# Patient Record
Sex: Male | Born: 1980 | Race: White | Hispanic: No | Marital: Married | Smoking: Current every day smoker
Health system: Southern US, Community
[De-identification: ages and names within clinical notes are randomized; demographics above are authoritative.]

## PROBLEM LIST (undated history)

## (undated) HISTORY — PX: TONSILLECTOMY: SUR1361

---

## 2006-01-09 ENCOUNTER — Emergency Department: Payer: Self-pay | Admitting: Emergency Medicine

## 2007-04-10 IMAGING — CR DG HAND COMPLETE 3+V*L*
1 series · 3 of 3 positions shown · non-contrast
Comparison: none

REASON FOR EXAM: Injury
COMMENTS:

[Series 1: view not recorded · 0.17mm/px · 3 of 3 slices shown]
[im 1/3]
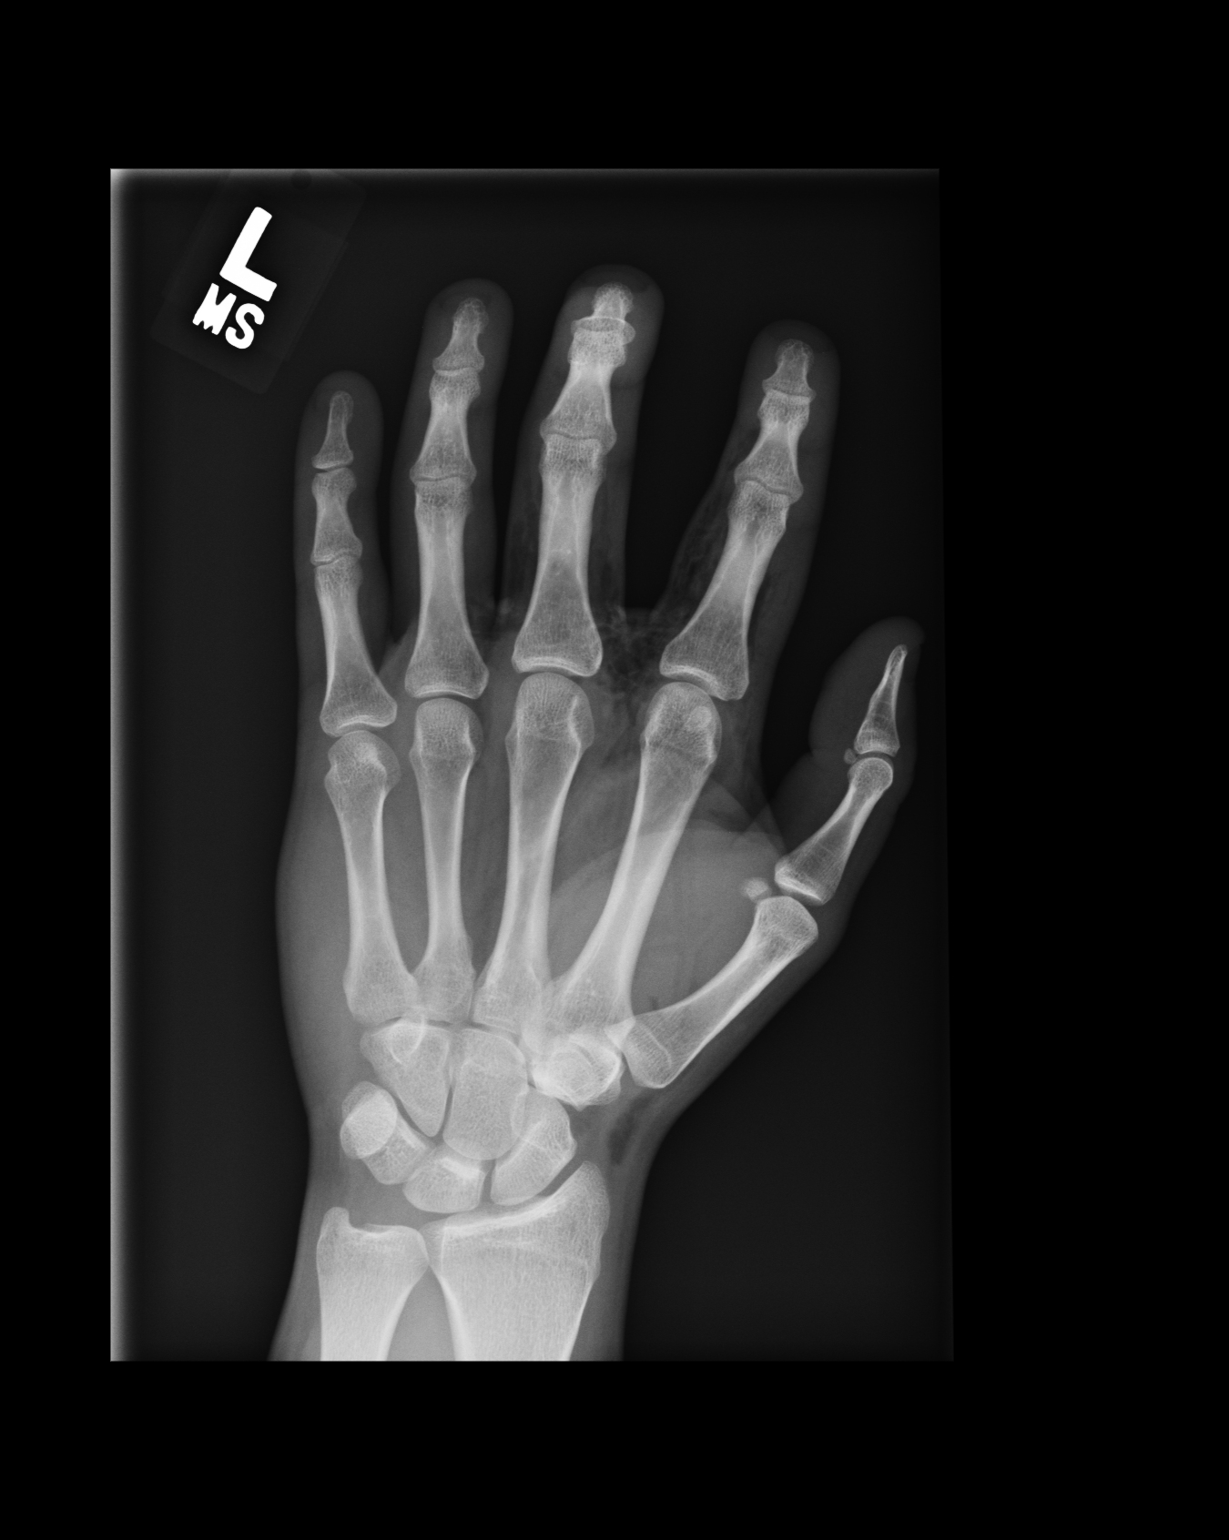
[im 2/3]
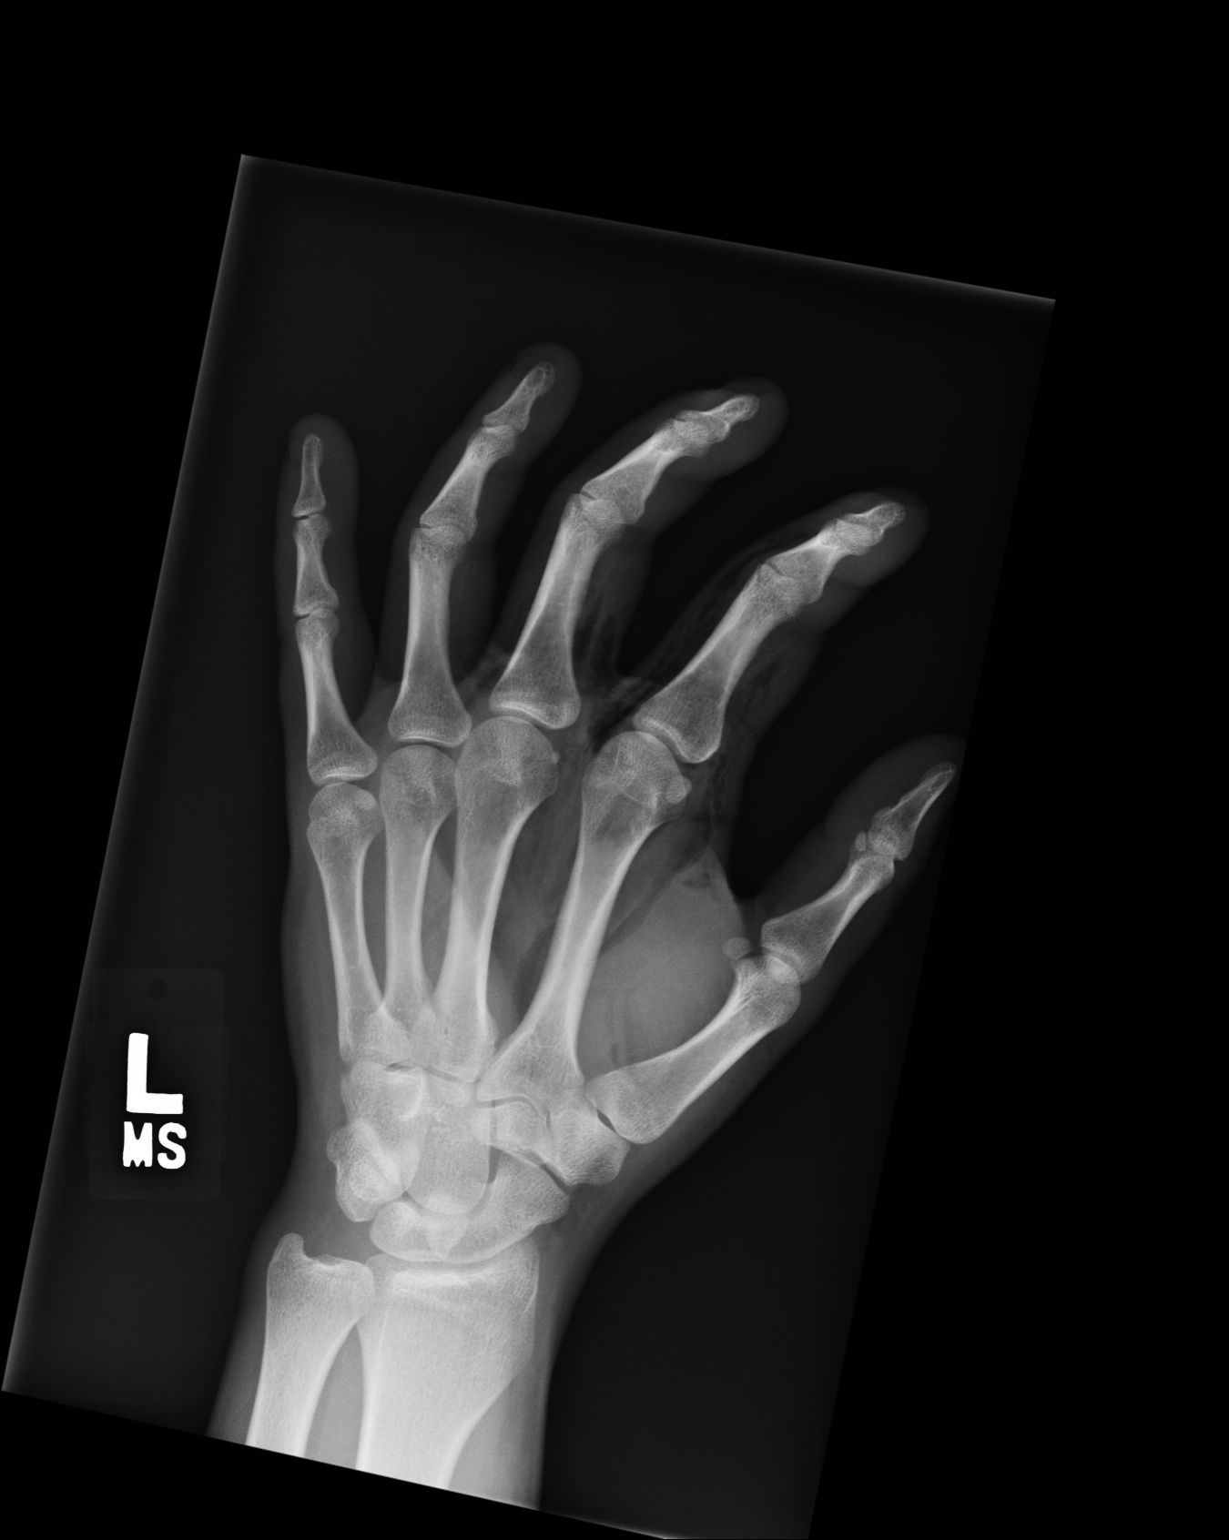
[im 3/3]
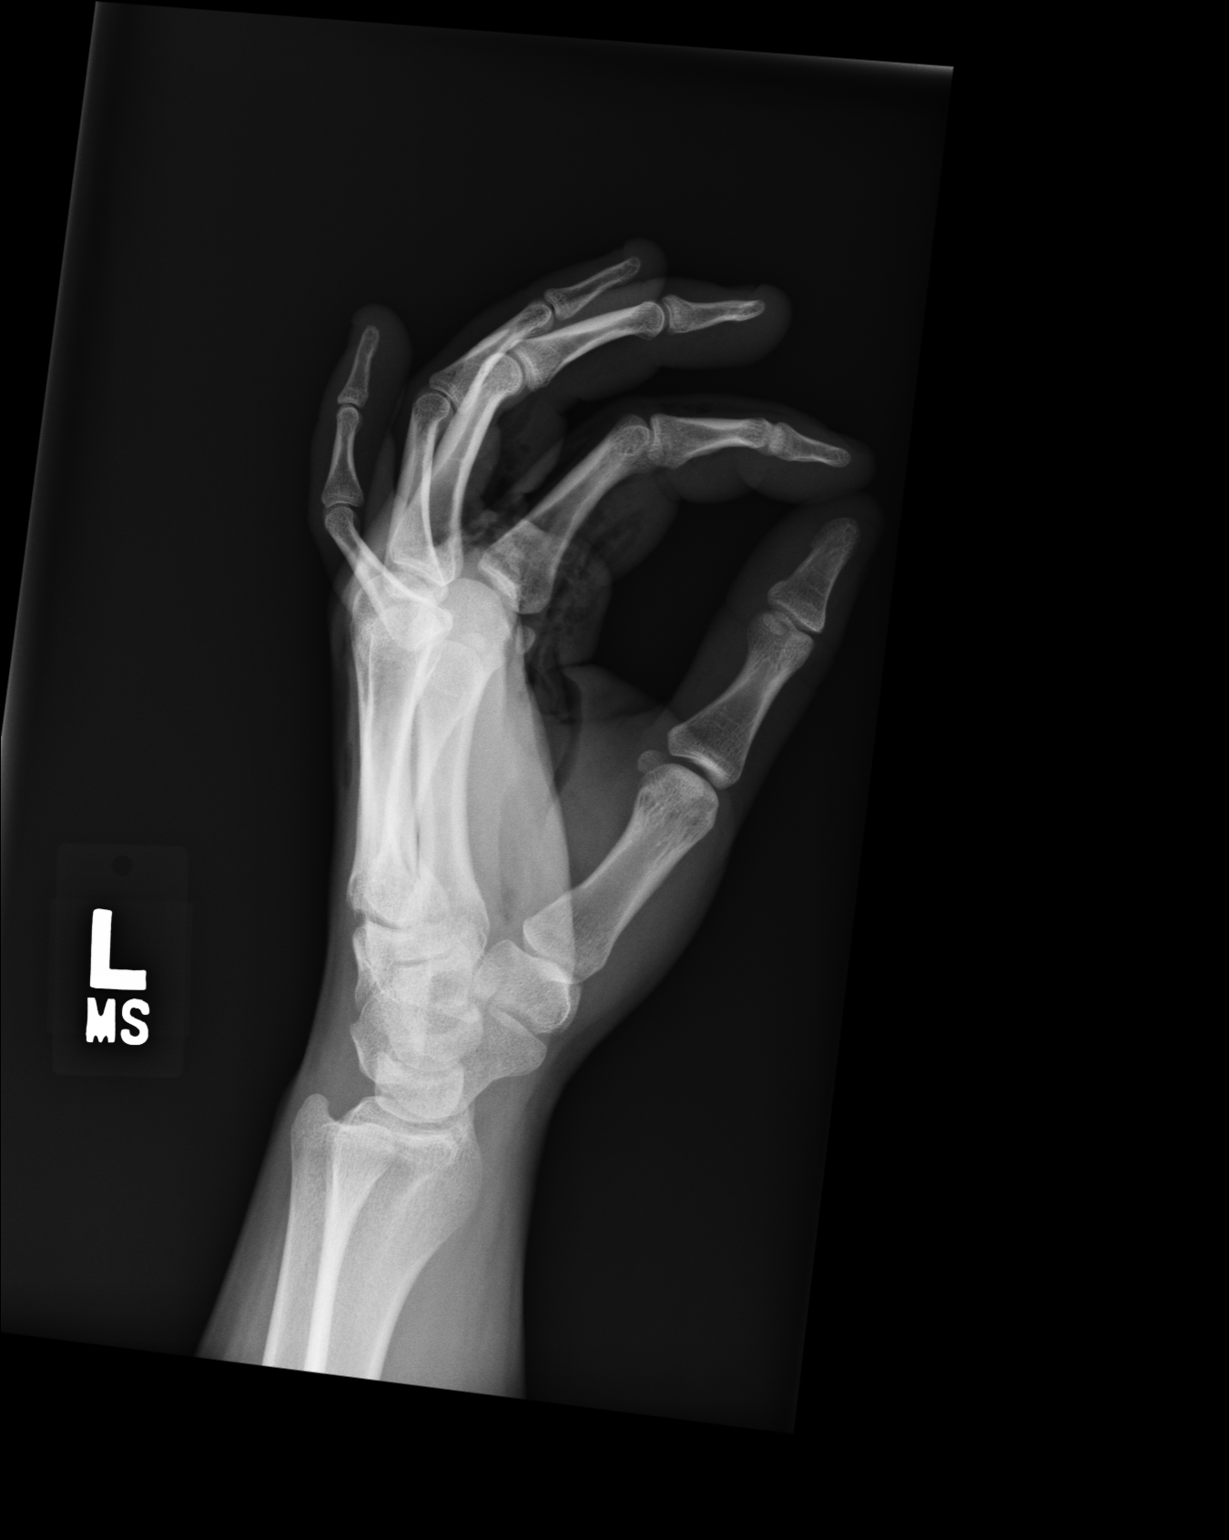

[3 of 3 positions shown; findings below may reference images not displayed]

PROCEDURE:     DXR - DXR HAND LT COMPLETE  W/OBLIQUES  - January 09, 2006  [DATE]

RESULT:        Subcutaneous emphysema is noted between the second and third
digits and along the base of the thumb.   Subcutaneous emphysema is noted
tracking along the palmar aspect of the second metacarpal.  Penetrating
injury or air-producing infection could present in this fashion.  No acute
bony abnormalities are identified.
IMPRESSION: Subcutaneous emphysema noted distributed as described above. This could be
secondary to
penetrating injury or air-producing infection.  No acute bony abnormalities
identified.

## 2007-05-19 ENCOUNTER — Emergency Department: Payer: Self-pay | Admitting: Emergency Medicine

## 2008-08-17 IMAGING — CR RIGHT INDEX FINGER 2+V
1 series · 3 of 3 positions shown · non-contrast
Comparison: none

REASON FOR EXAM: trauma
COMMENTS:

[Series 1: view not recorded · 0.17mm/px · 3 of 3 slices shown]
[im 1/3]
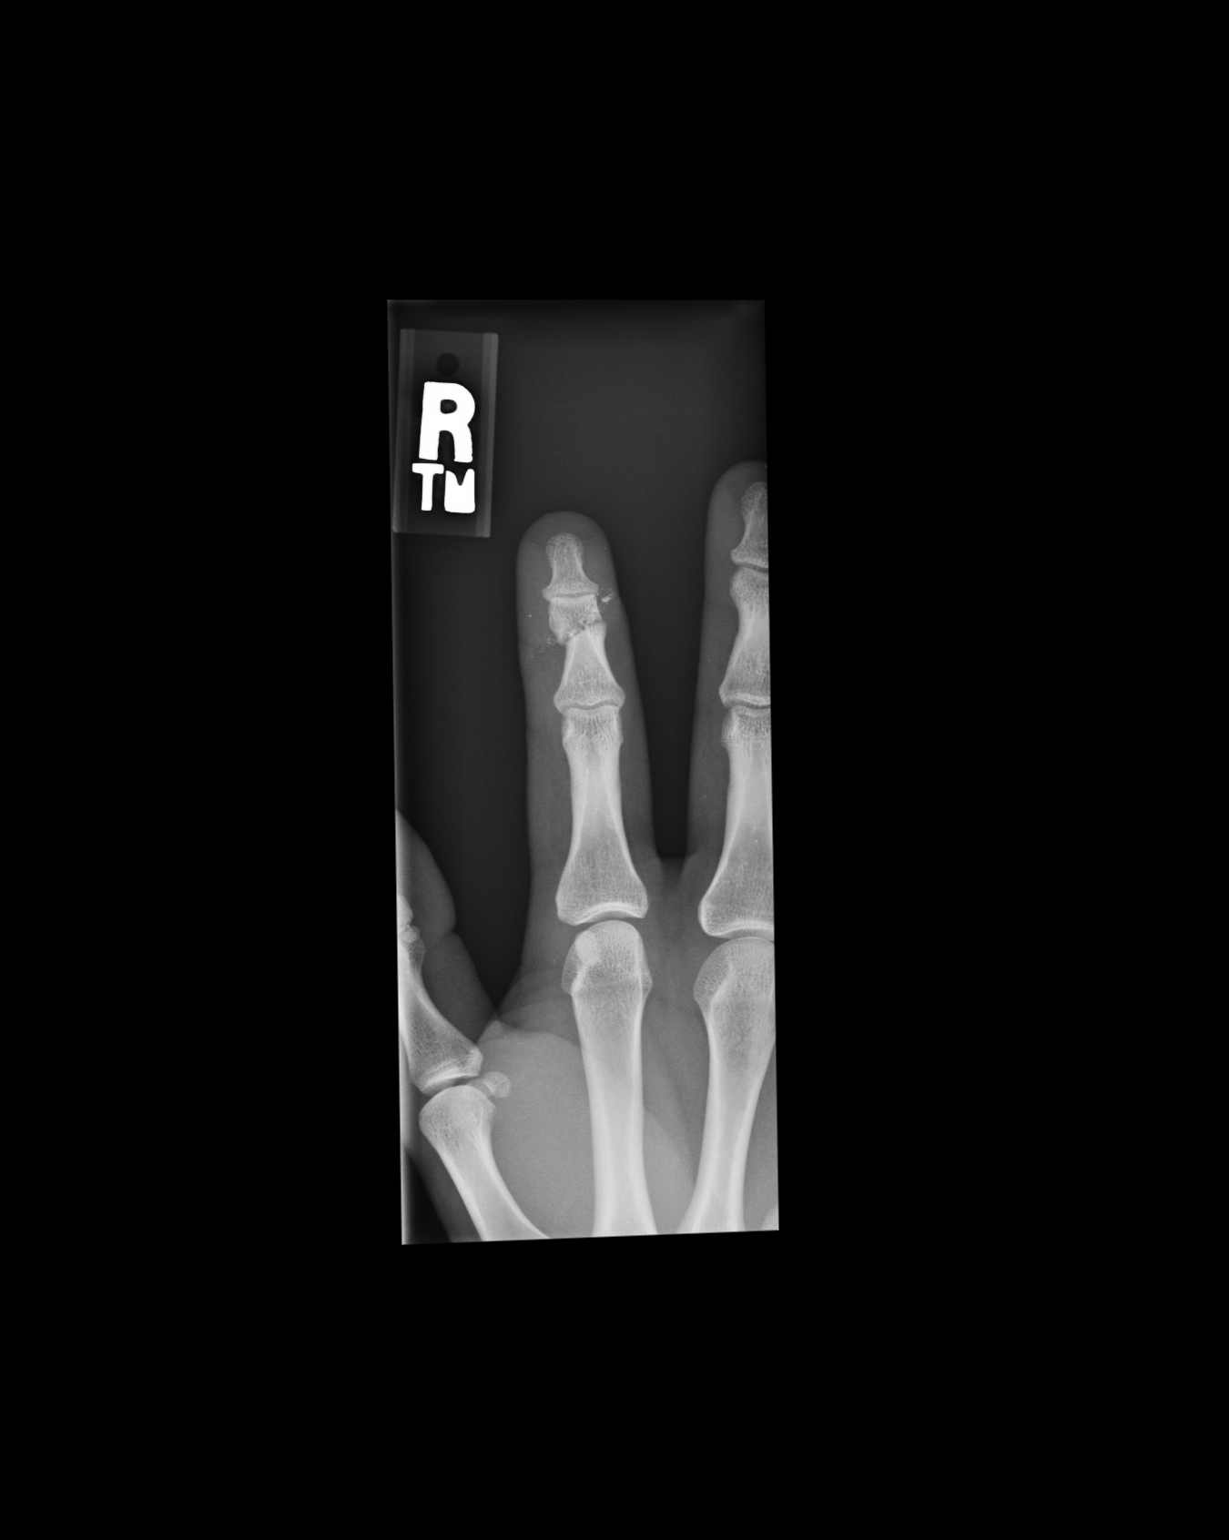
[im 2/3]
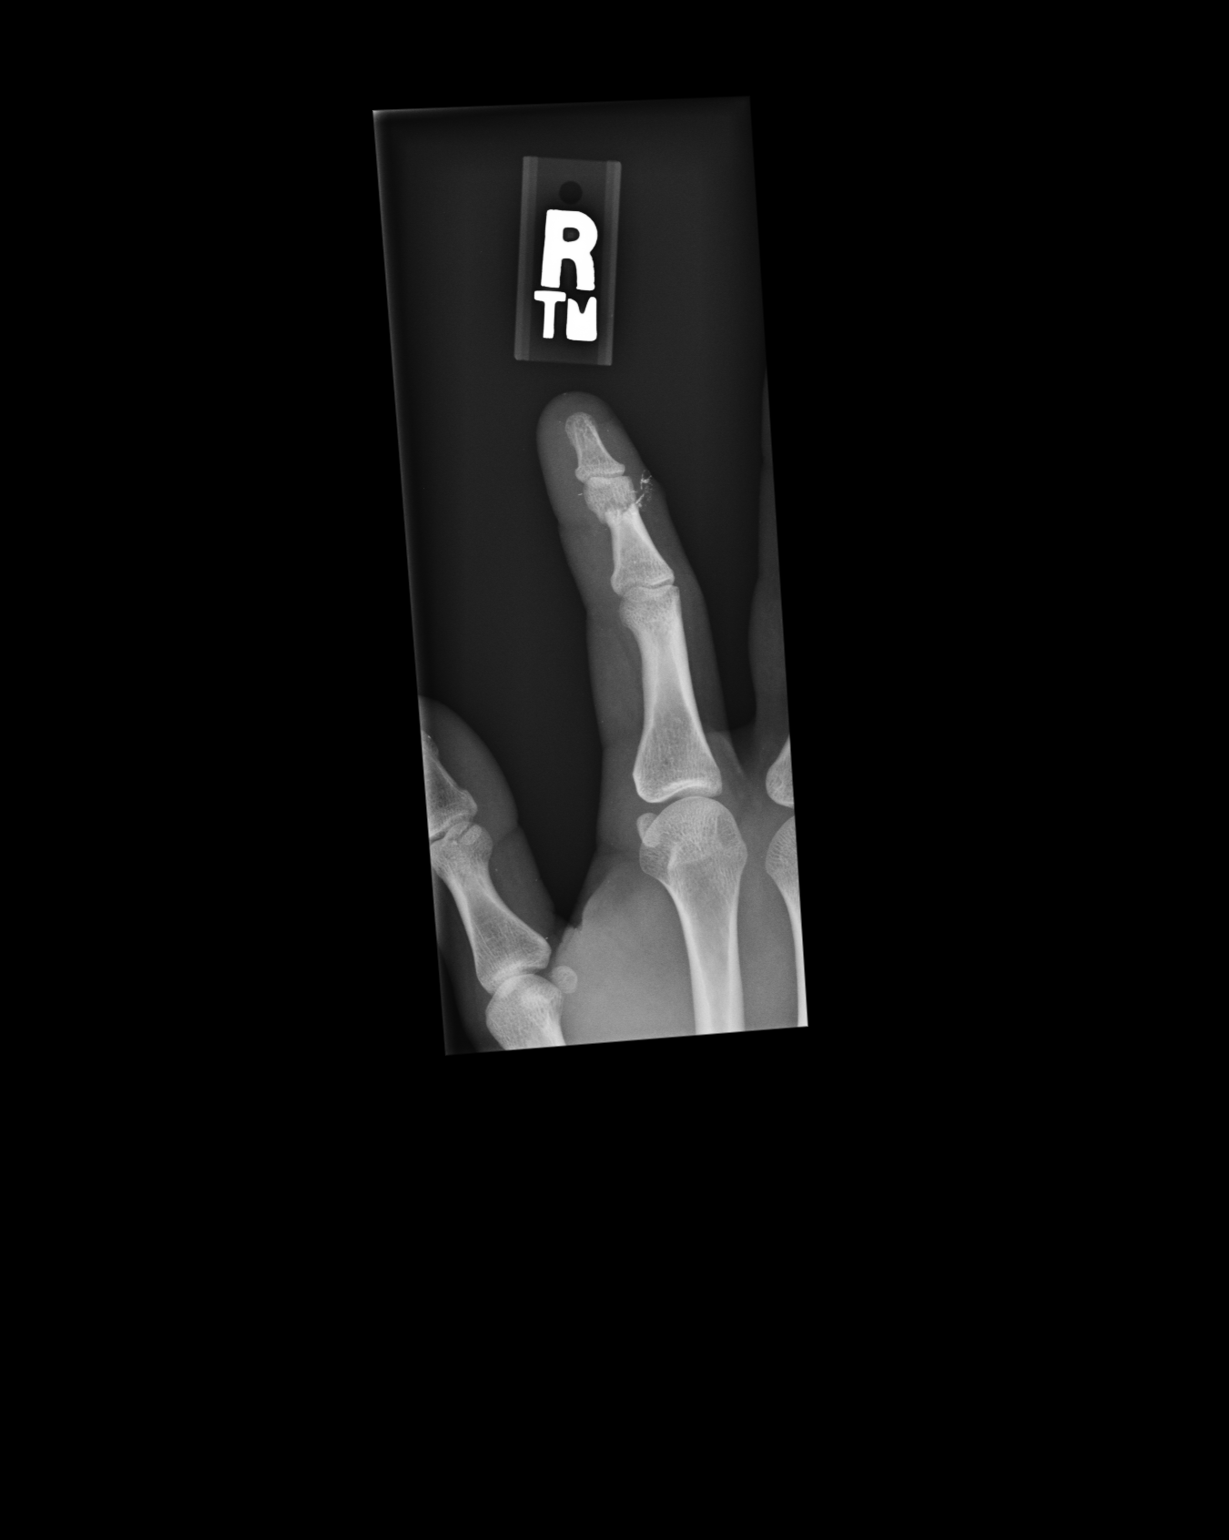
[im 3/3]
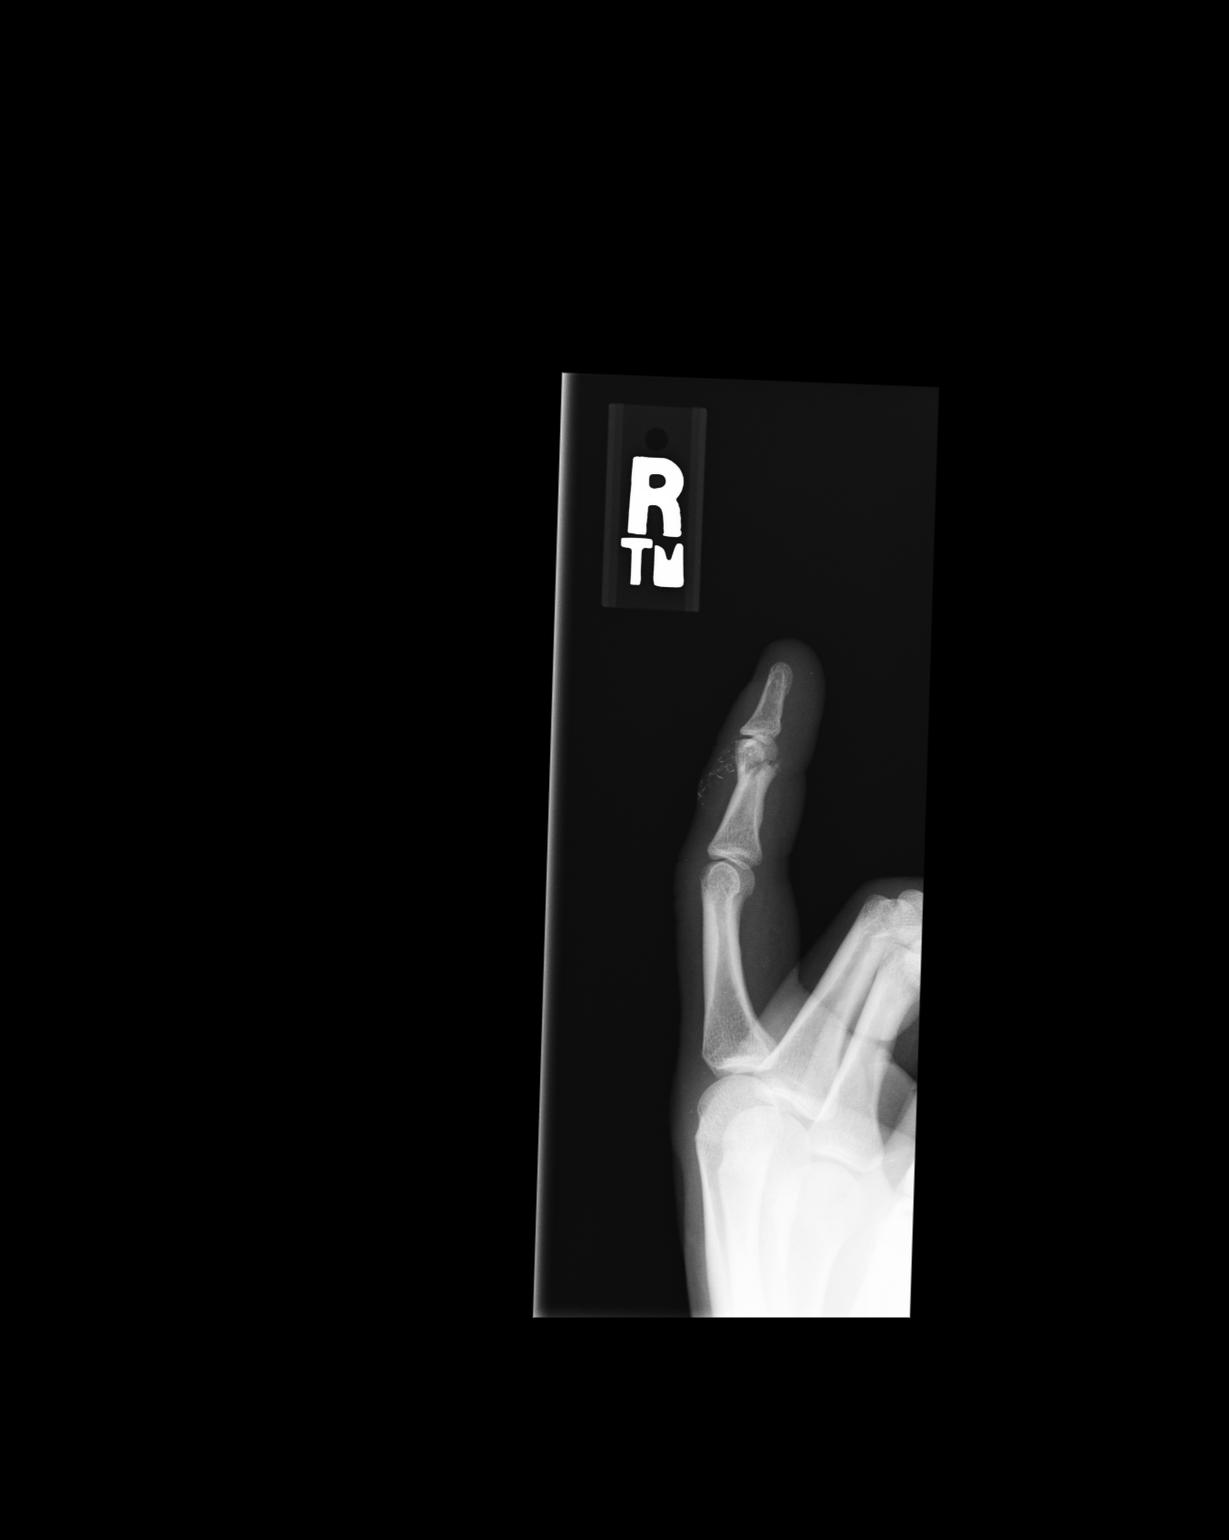

[3 of 3 positions shown; findings below may reference images not displayed]

PROCEDURE:     DXR - DXR FINGER INDEX 2ND DIGIT RT HA  - May 19, 2007  [DATE]

RESULT:     There is a transverse fracture of the distal portion of the
middle phalanx of the index finger. Radiodensities are noted in the soft
tissues medially and dorsally at the level of the fracture and appear to
represent small soft tissue metallic fragments. No other significant
abnormalities are identified.
IMPRESSION: 1.     Please see above.

## 2012-04-26 ENCOUNTER — Encounter (HOSPITAL_BASED_OUTPATIENT_CLINIC_OR_DEPARTMENT_OTHER): Payer: Self-pay | Admitting: *Deleted

## 2012-04-26 ENCOUNTER — Emergency Department (HOSPITAL_BASED_OUTPATIENT_CLINIC_OR_DEPARTMENT_OTHER)
Admission: EM | Admit: 2012-04-26 | Discharge: 2012-04-26 | Disposition: A | Discharge status: Home / Self Care | Payer: PRIVATE HEALTH INSURANCE | Attending: Emergency Medicine | Admitting: Emergency Medicine

## 2012-04-26 DIAGNOSIS — T6391XA Toxic effect of contact with unspecified venomous animal, accidental (unintentional), initial encounter: Secondary | ICD-10-CM | POA: Insufficient documentation

## 2012-04-26 DIAGNOSIS — T63461A Toxic effect of venom of wasps, accidental (unintentional), initial encounter: Secondary | ICD-10-CM | POA: Insufficient documentation

## 2012-04-26 DIAGNOSIS — R42 Dizziness and giddiness: Secondary | ICD-10-CM | POA: Insufficient documentation

## 2012-04-26 DIAGNOSIS — T63481A Toxic effect of venom of other arthropod, accidental (unintentional), initial encounter: Secondary | ICD-10-CM

## 2012-04-26 DIAGNOSIS — K089 Disorder of teeth and supporting structures, unspecified: Secondary | ICD-10-CM | POA: Insufficient documentation

## 2012-04-26 DIAGNOSIS — K029 Dental caries, unspecified: Secondary | ICD-10-CM | POA: Insufficient documentation

## 2012-04-26 DIAGNOSIS — K0889 Other specified disorders of teeth and supporting structures: Secondary | ICD-10-CM

## 2012-04-26 MED ORDER — CLINDAMYCIN HCL 150 MG PO CAPS: 150.0000 mg | ORAL_CAPSULE | Freq: Three times a day (TID) | ORAL | Status: AC

## 2012-04-26 NOTE — ED Notes (Signed)
Pt states he was mowing a couple of hours ago and got stung on his left ankle. Took shower and noticed a red rash was appearing. No SHOB, but some gum pain.

## 2012-04-26 NOTE — Discharge Instructions (Signed)
Bee, Wasp, or Hornet Sting Your caregiver has diagnosed you as having an insect sting. An insect sting appears as a red lump in the skin that sometimes has a tiny hole in the center, or it may have a stinger in the center of the wound. The most common stings are from wasps, hornets and bees. Individuals have different reactions to insect stings.  A normal reaction may cause pain, swelling, and redness around the sting site.   A localized allergic reaction may cause swelling and redness that extends beyond the sting site.   A large local reaction may continue to develop over the next 12 to 36 hours.   On occasion, the reactions can be severe (anaphylactic reaction). An anaphylactic reaction may cause wheezing; difficulty breathing; chest pain; fainting; raised, itchy, red patches on the skin; a sick feeling to your stomach (nausea); vomiting; cramping; or diarrhea. If you have had an anaphylactic reaction to an insect sting in the past, you are more likely to have one again.  HOME CARE INSTRUCTIONS   With bee stings, a small sac of poison is left in the wound. Brushing across this with something such as a credit card, or anything similar, will help remove this and decrease the amount of the reaction. This same procedure will not help a wasp sting as they do not leave behind a stinger and poison sac.   Apply a cold compress for 10 to 20 minutes every hour for 1 to 2 days, depending on severity, to reduce swelling and itching.   To lessen pain, a paste made of water and baking soda may be rubbed on the bite or sting and left on for 5 minutes.   To relieve itching and swelling, you may use take medication or apply medicated creams or lotions as directed.   Only take over-the-counter or prescription medicines for pain, discomfort, or fever as directed by your caregiver.   Wash the sting site daily with soap and water. Apply antibiotic ointment on the sting site as directed.   If you suffered a  severe reaction:   If you did not require hospitalization, an adult will need to stay with you for 24 hours in case the symptoms return.   You may need to wear a medical bracelet or necklace stating the allergy.   You and your family need to learn when and how to use an anaphylaxis kit or epinephrine injection.   If you have had a severe reaction before, always carry your anaphylaxis kit with you.  SEEK MEDICAL CARE IF:   None of the above helps within 2 to 3 days.   The area becomes red, warm, tender, and swollen beyond the area of the bite or sting.   You have an oral temperature above 102 F (38.9 C).  SEEK IMMEDIATE MEDICAL CARE IF:  You have symptoms of an allergic reaction which are:  Wheezing.   Difficulty breathing.   Chest pain.   Lightheadedness or fainting.   Itchy, raised, red patches on the skin.   Nausea, vomiting, cramping or diarrhea.  ANY OF THESE SYMPTOMS MAY REPRESENT A SERIOUS PROBLEM THAT IS AN EMERGENCY. Do not wait to see if the symptoms will go away. Get medical help right away. Call your local emergency services (911 in [place] DO NOT drive yourself to the hospital. MAKE SURE YOU:   Understand these instructions.   Will watch your condition.   Will get help right away if you are not doing well or  get worse.  Document Released: 10/14/2005 Document Revised: 10/03/2011 Document Reviewed: 03/31/2010 [facility] Patient Information 2012 [place], [place].Dental Pain A tooth ache may be caused by cavities (tooth decay). Cavities expose the nerve of the tooth to air and hot or cold temperatures. It may come from an infection or abscess (also called a boil or furuncle) around your tooth. It is also often caused by dental caries (tooth decay). This causes the pain you are having. DIAGNOSIS  Your caregiver can diagnose this problem by exam. TREATMENT   If caused by an infection, it may be treated with medications which kill germs (antibiotics) and pain  medications as prescribed by your caregiver. Take medications as directed.   Only take over-the-counter or prescription medicines for pain, discomfort, or fever as directed by your caregiver.   Whether the tooth ache today is caused by infection or dental disease, you should see your dentist as soon as possible for further care.  SEEK MEDICAL CARE IF: The exam and treatment you received today has been provided on an emergency basis only. This is not a substitute for complete medical or dental care. If your problem worsens or new problems (symptoms) appear, and you are unable to meet with your dentist, call or return to this location. SEEK IMMEDIATE MEDICAL CARE IF:   You have a fever.   You develop redness and swelling of your face, jaw, or neck.   You are unable to open your mouth.   You have severe pain uncontrolled by pain medicine.  MAKE SURE YOU:   Understand these instructions.   Will watch your condition.   Will get help right away if you are not doing well or get worse.  Document Released: 10/14/2005 Document Revised: 10/03/2011 Document Reviewed: 06/01/2008 [facility] Patient Information 2012 [place], [place].

## 2012-04-26 NOTE — ED Provider Notes (Signed)
History     CSN: [id number]  Arrival date & time 04/26/12  1659   First MD Initiated Contact with Patient 04/26/12 1715      Chief Complaint  Patient presents with  . Insect Bite    (Consider location/radiation/quality/duration/timing/severity/associated sxs/prior treatment) HPI Comments: Pt c/o right upper dentition pain:pt states that he was stung by a bee on his left ankle and now he has redness to the area  Patient is a 31 y.o. male presenting with tooth pain. The history is provided by the patient. No language interpreter was used.  Dental PainThe primary symptoms include mouth pain. The symptoms began 5 to 7 days ago. The symptoms are unchanged. The symptoms occur constantly.  Additional symptoms include: gum tenderness.    History reviewed. No pertinent past medical history.  Past Surgical History  Procedure Date  . Tonsillectomy     History reviewed. No pertinent family history.  History  Substance Use Topics  . Smoking status: Current Everyday Smoker  . Smokeless tobacco: Not on file  . Alcohol Use: Yes      Review of Systems  Constitutional: Negative.   Respiratory: Negative.   Cardiovascular: Negative.   Neurological: Positive for dizziness.    Allergies  Penicillins  Home Medications   Current Outpatient Rx  Name Route Sig Dispense Refill  . DIPHENHYDRAMINE HCL 25 MG PO TABS Oral Take 50 mg by mouth once as needed. For allergic reaction    . MELATONIN 5 MG PO TABS Oral Take 1 tablet by mouth at bedtime as needed. For sleep    . CLINDAMYCIN HCL 150 MG PO CAPS Oral Take 1 capsule (150 mg total) by mouth 3 (three) times daily. 21 capsule 0    BP 149/94  Pulse 60  Temp 98.2 F (36.8 C) (Oral)  Resp 18  Ht 6' (1.829 m)  Wt 163 lb (73.936 kg)  BMI 22.11 kg/m2  SpO2 100%  Physical Exam  Nursing note and vitals reviewed. Constitutional: He is oriented to person, place, and time. He appears well-developed and well-nourished.  HENT:  Right  Ear: External ear normal.  Left Ear: External ear normal.       Pt has multiple decayed teeth  Eyes: Conjunctivae and EOM are normal.  Cardiovascular: Normal rate and regular rhythm.   Pulmonary/Chest: Effort normal and breath sounds normal.  Musculoskeletal: Normal range of motion.  Neurological: He is alert and oriented to person, place, and time.  Skin:       Pt has localized redness and swelling noted to the left lateral ankle  Psychiatric: He has a normal mood and affect.    ED Course  Procedures (including critical care time)  Labs Reviewed - No data to display No results found.   1. Insect sting   2. Toothache       MDM  No intervention at this time for the sting:pt placed on clindamycin for the toothache        Teressa Lower, NP 04/26/12 1752

## 2012-04-26 NOTE — ED Provider Notes (Signed)
History/physical exam/procedure(s) were performed by non-physician practitioner and as supervising physician I was immediately available for consultation/collaboration. I have reviewed all notes and am in agreement with care and plan.   Hilario Quarry, MD 04/26/12 2014

## 2012-09-24 ENCOUNTER — Encounter (HOSPITAL_BASED_OUTPATIENT_CLINIC_OR_DEPARTMENT_OTHER): Payer: Self-pay | Admitting: *Deleted

## 2012-09-24 ENCOUNTER — Emergency Department (HOSPITAL_BASED_OUTPATIENT_CLINIC_OR_DEPARTMENT_OTHER)
Admission: EM | Admit: 2012-09-24 | Discharge: 2012-09-24 | Disposition: A | Discharge status: Home / Self Care | Payer: PRIVATE HEALTH INSURANCE | Attending: Emergency Medicine | Admitting: Emergency Medicine

## 2012-09-24 DIAGNOSIS — Z79899 Other long term (current) drug therapy: Secondary | ICD-10-CM | POA: Insufficient documentation

## 2012-09-24 DIAGNOSIS — K029 Dental caries, unspecified: Secondary | ICD-10-CM | POA: Insufficient documentation

## 2012-09-24 DIAGNOSIS — F17200 Nicotine dependence, unspecified, uncomplicated: Secondary | ICD-10-CM | POA: Insufficient documentation

## 2012-09-24 MED ORDER — HYDROCODONE-ACETAMINOPHEN 5-325 MG PO TABS: 1.0000 | ORAL_TABLET | Freq: Four times a day (QID) | ORAL | Status: DC | PRN

## 2012-09-24 MED ORDER — CLINDAMYCIN HCL 300 MG PO CAPS: 300.0000 mg | ORAL_CAPSULE | Freq: Three times a day (TID) | ORAL | Status: DC

## 2012-09-24 MED ORDER — CLINDAMYCIN HCL 150 MG PO CAPS
300.0000 mg | ORAL_CAPSULE | Freq: Once | ORAL | Status: AC
  Administered 2012-09-24: 300 mg via ORAL
  Filled 2012-09-24 (×2): qty 2

## 2012-09-24 NOTE — ED Notes (Signed)
Thinks may be getting an abcess is going out of town thought he may need antibiotics

## 2012-09-24 NOTE — ED Provider Notes (Signed)
History     CSN: [id number] Arrival date & time 09/24/12  1002 First MD Initiated Contact with Patient 09/24/12 1019     Chief Complaint  Patient presents with  . Dental Pain    HPI The patient is concerned that he may be developing a dental infection. He has history of cavities. Since yesterday he noticed with chewing he has been having pain in his right upper canine region. The pain increases when he chews. He thought he noticed some drainage yesterday as well. He denies any fevers. He has had a little bit of sinus congestion he thinks this is unrelated. Denies any sore throat or earache. The patient is going out of town today for the holidays and wanted to get some antibiotics before he went so it did not get any worse. No past medical history on file.  Past Surgical History  Procedure Date  . Tonsillectomy     No family history on file.  History  Substance Use Topics  . Smoking status: Current Every Day Smoker  . Smokeless tobacco: Not on file  . Alcohol Use: Yes      Review of Systems  All other systems reviewed and are negative.    Allergies  Penicillins  Home Medications   Current Outpatient Rx  Name  Route  Sig  Dispense  Refill  . DIPHENHYDRAMINE HCL 25 MG PO TABS   Oral   Take 50 mg by mouth once as needed. For allergic reaction         . MELATONIN 5 MG PO TABS   Oral   Take 1 tablet by mouth at bedtime as needed. For sleep           BP 136/75  Pulse 66  Temp 98.5 F (36.9 C) (Oral)  Resp 18  Ht 6" (0.152 m)  Wt 160 lb (72.576 kg)  BMI 3,124.81 kg/m2  SpO2 99%  Physical Exam  Nursing note and vitals reviewed. Constitutional: He appears well-developed and well-nourished. No distress.  HENT:  Head: Normocephalic and atraumatic. No trismus in the jaw.  Right Ear: External ear normal.  Left Ear: External ear normal.  Mouth/Throat: Mucous membranes are normal. Dental caries present. No dental abscesses or uvula swelling. No oropharyngeal  exudate.  Eyes: Conjunctivae normal are normal. Right eye exhibits no discharge. Left eye exhibits no discharge. No scleral icterus.  Neck: Neck supple. No tracheal deviation present.  Cardiovascular: Normal rate.   Pulmonary/Chest: Effort normal. No stridor. No respiratory distress.  Musculoskeletal: He exhibits no edema.  Lymphadenopathy:    He has no cervical adenopathy.  Neurological: He is alert. Cranial nerve deficit: no gross deficits.  Skin: Skin is warm and dry. No rash noted.  Psychiatric: He has a normal mood and affect.    ED Course  Procedures (including critical care time)  Labs Reviewed - No data to display No results found.   1. Dental caries       MDM  Symptoms are consistent with a tooth infection. There are no obvious signs of a severe abscess. He has no significant facial swelling. At this time there does not appear to be any evidence of an emergency medical condition. I will refer him to a dentist/oral surgeon. Patient will be given prescriptions  for pain medications and antibiotics.          Celene Kras, MD 09/24/12 [phone]

## 2016-11-27 ENCOUNTER — Emergency Department (HOSPITAL_BASED_OUTPATIENT_CLINIC_OR_DEPARTMENT_OTHER)
Admission: EM | Admit: 2016-11-27 | Discharge: 2016-11-27 | Disposition: A | Discharge status: Home / Self Care | Payer: BLUE CROSS/BLUE SHIELD | Attending: Emergency Medicine | Admitting: Emergency Medicine

## 2016-11-27 ENCOUNTER — Encounter (HOSPITAL_BASED_OUTPATIENT_CLINIC_OR_DEPARTMENT_OTHER): Payer: Self-pay

## 2016-11-27 DIAGNOSIS — F17210 Nicotine dependence, cigarettes, uncomplicated: Secondary | ICD-10-CM | POA: Diagnosis not present

## 2016-11-27 DIAGNOSIS — Y939 Activity, unspecified: Secondary | ICD-10-CM | POA: Insufficient documentation

## 2016-11-27 DIAGNOSIS — S51012A Laceration without foreign body of left elbow, initial encounter: Secondary | ICD-10-CM | POA: Diagnosis not present

## 2016-11-27 DIAGNOSIS — W268XXA Contact with other sharp object(s), not elsewhere classified, initial encounter: Secondary | ICD-10-CM | POA: Insufficient documentation

## 2016-11-27 DIAGNOSIS — Y929 Unspecified place or not applicable: Secondary | ICD-10-CM | POA: Diagnosis not present

## 2016-11-27 DIAGNOSIS — Y999 Unspecified external cause status: Secondary | ICD-10-CM | POA: Diagnosis not present

## 2016-11-27 DIAGNOSIS — Z23 Encounter for immunization: Secondary | ICD-10-CM | POA: Diagnosis not present

## 2016-11-27 MED ORDER — TETANUS-DIPHTH-ACELL PERTUSSIS 5-2.5-18.5 LF-MCG/0.5 IM SUSP
INTRAMUSCULAR | Status: AC
  Filled 2016-11-27: qty 0.5

## 2016-11-27 MED ORDER — LIDOCAINE HCL (PF) 1 % IJ SOLN: 5.0000 mL | Freq: Once | INTRAMUSCULAR | Status: DC

## 2016-11-27 MED ORDER — LIDOCAINE HCL 1 % IJ SOLN
INTRAMUSCULAR | Status: AC
  Filled 2016-11-27: qty 20

## 2016-11-27 MED ORDER — TETANUS-DIPHTH-ACELL PERTUSSIS 5-2.5-18.5 LF-MCG/0.5 IM SUSP
0.5000 mL | Freq: Once | INTRAMUSCULAR | Status: AC
  Administered 2016-11-27: 0.5 mL via INTRAMUSCULAR

## 2016-11-27 NOTE — ED Triage Notes (Signed)
Pt cut left elbow on a nail approx 6pm-lac noted with steri strips in place-no bleed through-NAD-steady gait

## 2016-11-27 NOTE — ED Provider Notes (Signed)
[facility] Provider Note   CSN: [id number] Arrival date & time: 11/27/16  1812  By signing my name below, I, Bing NeighborsMaurice Deon Copeland Jr., attest that this documentation has been prepared under the direction and in the presence of No att. providers found. Electronically signed: Bing NeighborsMaurice Deon Copeland Jr., ED Scribe. 11/27/16. 11:38 PM.    History   Chief Complaint Chief Complaint  Patient presents with  . Extremity Laceration    HPI  Bruce Gutierrez is a 36 y.o. male who presents to the Emergency Department complaining of laceration to the dorsum of the L elbow with sudden onset x1 hour. Pt states that he cut his L elbow on a nail at approximately 6PM today. Pt reportedly flushed the wound with peroxide followed by water. He then placed steri strips on the wound. Tetanus is UTD.   The history is provided by the patient. No language interpreter was used.    History reviewed. No pertinent past medical history.  There are no active problems to display for this patient.   Past Surgical History:  Procedure Laterality Date  . TONSILLECTOMY         Home Medications    Prior to Admission medications   Not on File    Family History No family history on file.  Social History Social History  Substance Use Topics  . Smoking status: Current Every Day Smoker    Types: E-cigarettes  . Smokeless tobacco: Never Used  . Alcohol use No     Allergies   Penicillins   Review of Systems Review of Systems  Constitutional: Negative for fever.  Skin: Positive for wound (laceration to dorsal aspect of L elbow).     Physical Exam Updated Vital Signs BP 153/98 (BP Location: Right Arm)   Pulse 74   Temp 98 F (36.7 C) (Oral)   Resp 18   Ht 6' (1.829 m)   Wt 182 lb (82.6 kg)   SpO2 100%   BMI 24.68 kg/m   Physical Exam  Constitutional: He appears well-developed and well-nourished.  HENT:  Head: Normocephalic and atraumatic.  Cardiovascular: Normal rate.    Murmur heard. Musculoskeletal: He exhibits no edema.  2 cm laceration posterior elbow. NV intact distally.  Neurological: He is alert.  Skin: Skin is warm. Laceration noted.  Dorsal aspect of L elbow with 2cm laceration just distal to elbow. 3 steri strips on the wound, well approximated, neurovascularly intact in the hand.   Psychiatric: He has a normal mood and affect.  Nursing note and vitals reviewed.    ED Treatments / Results   DIAGNOSTIC STUDIES: Oxygen Saturation is 100% on RA, normal by my interpretation.   COORDINATION OF CARE: 11:38 PM-Discussed next steps with pt. Pt verbalized understanding and is agreeable with the plan.    Labs (all labs ordered are listed, but only abnormal results are displayed) Labs Reviewed - No data to display  EKG  EKG Interpretation None       Radiology No results found.  Procedures Procedures (including critical care time)  Medications Ordered in ED Medications  Tdap (BOOSTRIX) injection 0.5 mL (0.5 mLs Intramuscular Given 11/27/16 1920)     Initial Impression / Assessment and Plan / ED Course  I have reviewed the triage vital signs and the nursing notes.  Pertinent labs & imaging results that were available during my care of the patient were reviewed by me and considered in my medical decision making (see chart for details).  Patient with laceration. Closed. Tetanus updated. Discharge.  LACERATION REPAIR Performed by: Billee Cashing Authorized by: Billee Cashing Consent: Verbal consent obtained. Risks and benefits: risks, benefits and alternatives were discussed Consent given by: patient Patient identity confirmed: provided demographic data Prepped and Draped in normal sterile fashion Wound explored  Laceration Location: left elbow  Laceration Length: 2cm  No Foreign Bodies seen or palpated  Anesthesia: local infiltration  Local anesthetic: lidocaine 1%   Anesthetic total: 3  ml  Irrigation method: scrub Amount of cleaning: standard  Skin closure: 4-0 proline  Number of sutures: 5  Technique: simple interrupted  Patient tolerance: Patient tolerated the procedure well with no immediate complications.  Final Clinical Impressions(s) / ED Diagnoses   Final diagnoses:  Laceration of left elbow, initial encounter    New Prescriptions There are no discharge medications for this patient.  I personally performed the services described in this documentation, which was scribed in my presence. The recorded information has been reviewed and is accurate.       Benjiman Core, MD 11/27/16 [phone]

## 2016-11-27 NOTE — ED Notes (Signed)
ED Provider at bedside. 

## 2016-11-27 NOTE — Discharge Instructions (Signed)
Stitches out in 10-14 days.

## 2023-03-05 ENCOUNTER — Encounter (HOSPITAL_BASED_OUTPATIENT_CLINIC_OR_DEPARTMENT_OTHER): Payer: Self-pay | Admitting: Emergency Medicine

## 2023-03-05 ENCOUNTER — Emergency Department (HOSPITAL_BASED_OUTPATIENT_CLINIC_OR_DEPARTMENT_OTHER): Payer: Worker's Compensation

## 2023-03-05 ENCOUNTER — Emergency Department (HOSPITAL_BASED_OUTPATIENT_CLINIC_OR_DEPARTMENT_OTHER)
Admission: EM | Admit: 2023-03-05 | Discharge: 2023-03-05 | Disposition: A | Discharge status: Home / Self Care | Payer: Worker's Compensation | Attending: Emergency Medicine | Admitting: Emergency Medicine

## 2023-03-05 ENCOUNTER — Other Ambulatory Visit: Payer: Self-pay

## 2023-03-05 DIAGNOSIS — W208XXA Other cause of strike by thrown, projected or falling object, initial encounter: Secondary | ICD-10-CM | POA: Insufficient documentation

## 2023-03-05 DIAGNOSIS — Y990 Civilian activity done for income or pay: Secondary | ICD-10-CM | POA: Diagnosis not present

## 2023-03-05 DIAGNOSIS — S90932A Unspecified superficial injury of left great toe, initial encounter: Secondary | ICD-10-CM | POA: Diagnosis present

## 2023-03-05 DIAGNOSIS — S92425A Nondisplaced fracture of distal phalanx of left great toe, initial encounter for closed fracture: Secondary | ICD-10-CM | POA: Diagnosis not present

## 2023-03-05 MED ORDER — HYDROCODONE-ACETAMINOPHEN 5-325 MG PO TABS
2.0000 | ORAL_TABLET | Freq: Once | ORAL | Status: AC
  Administered 2023-03-05: 2 via ORAL
  Filled 2023-03-05: qty 2

## 2023-03-05 MED ORDER — HYDROCODONE-ACETAMINOPHEN 5-325 MG PO TABS
1.0000 | ORAL_TABLET | ORAL | 0 refills | Status: AC | PRN
  Filled 2023-03-05: qty 15, 3d supply, fill #0

## 2023-03-05 NOTE — ED Triage Notes (Addendum)
Pt dropped plywood on LT great toe today at work

## 2023-03-05 NOTE — ED Provider Notes (Signed)
[facility] Provider Note   CSN: [id number] Arrival date & time: 03/05/23  1929     History  Chief Complaint  Patient presents with   Toe Injury    Bruce Gutierrez is a 42 y.o. male.  Patient complains of swelling and pain to his left first toe.  Patient reports he dropped a piece of plywood on his toe.  Patient complains of swelling and pain to his left first toe.  Complains of pain with walking.  The history is provided by the patient.       Home Medications Prior to Admission medications   Not on File      Allergies    Penicillins    Review of Systems   Review of Systems  Musculoskeletal:  Positive for joint swelling and myalgias.  All other systems reviewed and are negative.   Physical Exam Updated Vital Signs BP (!) 148/98   Pulse 87   Temp 98.8 F (37.1 C)   Resp 16   Ht 6' (1.829 m)   Wt 68 kg   SpO2 100%   BMI 20.34 kg/m  Physical Exam Vitals and nursing note reviewed.  Constitutional:      Appearance: He is well-developed.  HENT:     Head: Normocephalic.  Cardiovascular:     Rate and Rhythm: Normal rate.  Pulmonary:     Effort: Pulmonary effort is normal.  Abdominal:     General: There is no distension.  Musculoskeletal:        General: Swelling and tenderness present.     Comments: Bruised swollen left first toe ,pain with range of motion, neurovascular and neurosensory intact  Skin:    General: Skin is warm.  Neurological:     General: No focal deficit present.     Mental Status: He is alert and oriented to person, place, and time.  Psychiatric:        Mood and Affect: Mood normal.     ED Results / Procedures / Treatments   Labs (all labs ordered are listed, but only abnormal results are displayed) Labs Reviewed - No data to display  EKG None  Radiology DG Toe Great Left  Result Date: 03/05/2023 CLINICAL DATA:  Piece of plywood fell off forklift and landed on big toe EXAM:  LEFT GREAT TOE COMPARISON:  None Available. FINDINGS: Acute comminuted minimally displaced fracture of the distal phalanx of the big toe. One of the fracture line is within the lateral base which extends into the IP joint. Adjacent soft tissue swelling. IMPRESSION: Acute comminuted intra-articular fracture of the distal phalanx of the left big toe. If there is associated nail bed injury this should be considered an open fracture. Electronically Signed   By: Minerva Fester M.D.   On: 03/05/2023 20:01    Procedures Procedures    Medications Ordered in ED Medications - No data to display  ED Course/ Medical Decision Making/ A&P                             Medical Decision Making Amount and/or Complexity of Data Reviewed Radiology: ordered.    Details: X-ray shows acute comminuted minimally displaced fracture of the distal phalanx of the left first toe  Risk Prescription drug management. Risk Details: Patient advised to follow-up with orthopedist for recheck.  Patient is placed in a buddy tape and postop shoe.  Patient is given a prescription for  hydrocodone for pain           Final Clinical Impression(s) / ED Diagnoses Final diagnoses:  Closed nondisplaced fracture of distal phalanx of left great toe, initial encounter    Rx / DC Orders ED Discharge Orders          Ordered    HYDROcodone-acetaminophen (NORCO/VICODIN) 5-325 MG tablet  Every 4 hours PRN        03/05/23 2102          An After Visit Summary was printed and given to the patient.     Elson Areas, [place] 03/05/23 2336    Tegeler, Canary Brim, MD 03/06/23 0010

## 2023-03-05 NOTE — ED Notes (Signed)
Patient has discoloration on left great big toe in a 360 degree circumference. No obvious deformity. No active hemorrhage. Nail bed is intact. Back of the ankle as well as heel are also discolored slightly. Patient has full ROM in the affected extremity. Patient has less than 3 second capillary refill in the toe.

## 2023-03-06 ENCOUNTER — Other Ambulatory Visit (HOSPITAL_BASED_OUTPATIENT_CLINIC_OR_DEPARTMENT_OTHER): Payer: Self-pay
